# Patient Record
Sex: Male | Born: 1935 | Race: White | Hispanic: No | Marital: Married | Smoking: Former smoker
Health system: Southern US, Community
[De-identification: ages and names within clinical notes are randomized; demographics above are authoritative.]

## PROBLEM LIST (undated history)

## (undated) DIAGNOSIS — E785 Hyperlipidemia, unspecified: Secondary | ICD-10-CM

## (undated) DIAGNOSIS — K2270 Barrett's esophagus without dysplasia: Secondary | ICD-10-CM

## (undated) DIAGNOSIS — K219 Gastro-esophageal reflux disease without esophagitis: Secondary | ICD-10-CM

## (undated) DIAGNOSIS — I10 Essential (primary) hypertension: Secondary | ICD-10-CM

## (undated) HISTORY — PX: JOINT REPLACEMENT: SHX530

## (undated) HISTORY — PX: BACK SURGERY: SHX140

## (undated) HISTORY — PX: APPENDECTOMY: SHX54

---

## 2007-05-01 ENCOUNTER — Encounter: Admission: RE | Admit: 2007-05-01 | Discharge: 2007-05-01 | Payer: Self-pay | Admitting: Neurosurgery

## 2012-08-12 ENCOUNTER — Encounter (HOSPITAL_BASED_OUTPATIENT_CLINIC_OR_DEPARTMENT_OTHER): Payer: Self-pay | Admitting: *Deleted

## 2012-08-12 ENCOUNTER — Emergency Department (HOSPITAL_BASED_OUTPATIENT_CLINIC_OR_DEPARTMENT_OTHER)
Admission: EM | Admit: 2012-08-12 | Discharge: 2012-08-12 | Disposition: A | Discharge status: Other Acute Inpatient Hospital | Payer: Medicare Other | Attending: Emergency Medicine | Admitting: Emergency Medicine

## 2012-08-12 ENCOUNTER — Emergency Department (HOSPITAL_BASED_OUTPATIENT_CLINIC_OR_DEPARTMENT_OTHER): Payer: Medicare Other

## 2012-08-12 DIAGNOSIS — I10 Essential (primary) hypertension: Secondary | ICD-10-CM | POA: Insufficient documentation

## 2012-08-12 DIAGNOSIS — R209 Unspecified disturbances of skin sensation: Secondary | ICD-10-CM | POA: Insufficient documentation

## 2012-08-12 DIAGNOSIS — R42 Dizziness and giddiness: Secondary | ICD-10-CM | POA: Insufficient documentation

## 2012-08-12 DIAGNOSIS — R200 Anesthesia of skin: Secondary | ICD-10-CM

## 2012-08-12 DIAGNOSIS — R5381 Other malaise: Secondary | ICD-10-CM | POA: Insufficient documentation

## 2012-08-12 DIAGNOSIS — E785 Hyperlipidemia, unspecified: Secondary | ICD-10-CM | POA: Insufficient documentation

## 2012-08-12 DIAGNOSIS — R5383 Other fatigue: Secondary | ICD-10-CM | POA: Insufficient documentation

## 2012-08-12 DIAGNOSIS — Z8719 Personal history of other diseases of the digestive system: Secondary | ICD-10-CM | POA: Insufficient documentation

## 2012-08-12 DIAGNOSIS — R0609 Other forms of dyspnea: Secondary | ICD-10-CM | POA: Insufficient documentation

## 2012-08-12 DIAGNOSIS — R0989 Other specified symptoms and signs involving the circulatory and respiratory systems: Secondary | ICD-10-CM | POA: Insufficient documentation

## 2012-08-12 HISTORY — DX: Barrett's esophagus without dysplasia: K22.70

## 2012-08-12 HISTORY — DX: Hyperlipidemia, unspecified: E78.5

## 2012-08-12 HISTORY — DX: Essential (primary) hypertension: I10

## 2012-08-12 HISTORY — DX: Gastro-esophageal reflux disease without esophagitis: K21.9

## 2012-08-12 LAB — CBC WITH DIFFERENTIAL/PLATELET
Eosinophils Relative: 6 % — ABNORMAL HIGH (ref 0–5)
HCT: 39.6 % (ref 39.0–52.0)
Lymphocytes Relative: 27 % (ref 12–46)
Lymphs Abs: 1.8 10*3/uL (ref 0.7–4.0)
MCV: 92.3 fL (ref 78.0–100.0)
Monocytes Absolute: 0.8 10*3/uL (ref 0.1–1.0)
Neutro Abs: 3.9 10*3/uL (ref 1.7–7.7)
RBC: 4.29 MIL/uL (ref 4.22–5.81)
WBC: 7 10*3/uL (ref 4.0–10.5)

## 2012-08-12 LAB — COMPREHENSIVE METABOLIC PANEL
ALT: 26 U/L (ref 0–53)
AST: 18 U/L (ref 0–37)
CO2: 26 mEq/L (ref 19–32)
Calcium: 9.5 mg/dL (ref 8.4–10.5)
Chloride: 101 mEq/L (ref 96–112)
Creatinine, Ser: 1.1 mg/dL (ref 0.50–1.35)
GFR calc Af Amer: 73 mL/min — ABNORMAL LOW (ref >90)
GFR calc non Af Amer: 63 mL/min — ABNORMAL LOW (ref >90)
Glucose, Bld: 116 mg/dL — ABNORMAL HIGH (ref 70–99)
Sodium: 139 mEq/L (ref 135–145)
Total Bilirubin: 0.2 mg/dL — ABNORMAL LOW (ref 0.3–1.2)

## 2012-08-12 LAB — APTT: aPTT: 31 seconds (ref 24–37)

## 2012-08-12 NOTE — ED Notes (Addendum)
Pt sent here from [facility] for eval ? cva , pt states he woke this at am 6 with unsteady gait, dizzy and left sided  weakness.  Left grip is weaker than right

## 2012-08-12 NOTE — ED Notes (Signed)
Patient transported to CT 

## 2012-08-12 NOTE — ED Provider Notes (Signed)
History     CSN: [id number]  Arrival date & time 08/12/12  1526   First MD Initiated Contact with Patient 08/12/12 1545      Chief Complaint  Patient presents with  . Weakness  . Dizziness    (Consider location/radiation/quality/duration/timing/severity/associated sxs/prior treatment) HPI Pt states he woke this morning and felt dizzy while walking, losing his balance and falling against doorway. Dizziness described and feeling off balance and "head over feet" feeling. He also noticed L arm feeling like it was falling asleep and decreased sensation in LLE. States he took a "dizzy pill" and symptoms improved. Lucila Maine states he was sleepy after and had slurred speech.  Pt denies focal weakness currently though numbness still present. No dizziness currently. Pt admits to ongoing SOB especially with exertion. No cough, CP, fever, chills, N/V/D. Pt denies post cervical neck pain Past Medical History  Diagnosis Date  . Hypertension   . Barrett esophagus   . Hyperlipemia   . GERD (gastroesophageal reflux disease)     Past Surgical History  Procedure Date  . Back surgery   . Appendectomy   . Joint replacement     History reviewed. No pertinent family history.  History  Substance Use Topics  . Smoking status: Former Games developer  . Smokeless tobacco: Not on file  . Alcohol Use: No      Review of Systems  Constitutional: Negative for fever and chills.  HENT: Negative for hearing loss and neck pain.   Eyes: Negative for visual disturbance.  Respiratory: Positive for shortness of breath. Negative for cough and wheezing.   Cardiovascular: Negative for chest pain, palpitations and leg swelling.  Gastrointestinal: Negative for nausea, vomiting, abdominal pain, diarrhea and constipation.  Genitourinary: Negative for dysuria, hematuria and flank pain.  Musculoskeletal: Negative for myalgias and back pain.  Skin: Negative for pallor, rash and wound.  Neurological: Positive for  dizziness, speech difficulty and numbness. Negative for weakness, light-headedness and headaches.    Allergies  Review of patient's allergies indicates no known allergies.  Home Medications  No current outpatient prescriptions on file.  BP 139/97  Pulse 66  Temp 97.9 F (36.6 C) (Oral)  Resp 18  Ht 6' (1.829 m)  Wt 222 lb (100.699 kg)  BMI 30.11 kg/m2  SpO2 99%  Physical Exam  Nursing note and vitals reviewed. Constitutional: He is oriented to person, place, and time. He appears well-developed and well-nourished. No distress.  HENT:  Head: Normocephalic and atraumatic.  Mouth/Throat: Oropharynx is clear and moist.  Eyes: EOM are normal. Pupils are equal, round, and reactive to light.       Mild fatigable horizontal nystagmus   Neck: Normal range of motion. Neck supple.       No posterior cervical TTP  Cardiovascular: Normal rate and regular rhythm.  Exam reveals no gallop.   No murmur heard. Pulmonary/Chest: Effort normal and breath sounds normal. No respiratory distress. He has no wheezes. He has no rales.  Abdominal: Soft. Bowel sounds are normal. He exhibits no mass. There is no tenderness. There is no rebound and no guarding.  Musculoskeletal: Normal range of motion. He exhibits no edema and no tenderness.  Neurological: He is alert and oriented to person, place, and time.       Cn II-Xii intact. 5/5 motor in all ext. Decreased sensation in LUE/LLE. Finger to nose intact bl. Speech normal.   Skin: Skin is warm and dry. No rash noted. No erythema.  Psychiatric: He has a normal mood  and affect. His behavior is normal.    ED Course  Procedures (including critical care time)  Labs Reviewed  CBC WITH DIFFERENTIAL - Abnormal; Notable for the following:    Eosinophils Relative 6 (*)     All other components within normal limits  COMPREHENSIVE METABOLIC PANEL - Abnormal; Notable for the following:    Glucose, Bld 116 (*)     Total Bilirubin 0.2 (*)     GFR calc non Af  Amer 63 (*)     GFR calc Af Amer 73 (*)     All other components within normal limits  PROTIME-INR  APTT  PRO B NATRIURETIC PEPTIDE  TROPONIN I  URINALYSIS, ROUTINE W REFLEX MICROSCOPIC   Ct Head Wo Contrast  08/12/2012  *RADIOLOGY REPORT*  Clinical Data: Left-sided weakness and dizziness  CT HEAD WITHOUT CONTRAST  Technique:  Contiguous axial images were obtained from the base of the skull through the vertex without contrast.  Comparison: None.  Findings: The brain shows generalized atrophy.  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  There are chronic appearing small vessel changes in the hemispheric deep white matter.  No identifiably acute infarction, mass lesion, hemorrhage, hydrocephalus or extra-axial collection.  The calvarium is unremarkable.  There are some inflammatory changes of the maxillary sinuses and there is some fluid in the right mastoid air cells.  IMPRESSION: No identifiably acute infarction.  Chronic small vessel changes throughout the white matter.  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   Original Report Authenticated By: Paulina Fusi, M.D.    Dg Chest Port 1 View  08/12/2012  *RADIOLOGY REPORT*  Clinical Data: Unsteady gait and dizziness.  Possible stroke.  PORTABLE CHEST - 1 VIEW  Comparison: None.  Findings: 1557 hours.  There are low lung volumes with mild basilar atelectasis.  The heart size is at the upper limits of normal.  No confluent airspace opacity or pleural effusion is seen.  Telemetry leads overlie the chest.  IMPRESSION: Borderline heart size and low lung volumes.  No definite acute findings.   Original Report Authenticated By: Carey Bullocks, M.D.      1. Vertigo   2. Left sided numbness   3. Dyspnea on exertion      Date: 08/12/2012  Rate: 67  Rhythm: normal sinus rhythm  QRS Axis: left  Intervals: QRS prolonged  ST/T Wave abnormalities: normal  Conduction Disutrbances:nonspecific intraventricular conduction delay  Narrative Interpretation:   Old  EKG Reviewed: unchanged    MDM  Discussed with Dr Susie Cassette who will accept the pt in transfer.        Loren Racer, MD 08/12/12 1721

## 2012-08-12 NOTE — ED Notes (Addendum)
Pt resting quietly with family at bedside, denies any c/o or needs.

## 2014-01-31 IMAGING — CR DG CHEST 1V PORT
1 series · 1 of 1 positions shown · non-contrast
Comparison: None.

CLINICAL DATA: Unsteady gait and dizziness.  Possible stroke.

PORTABLE CHEST - 1 VIEW

[view not recorded]
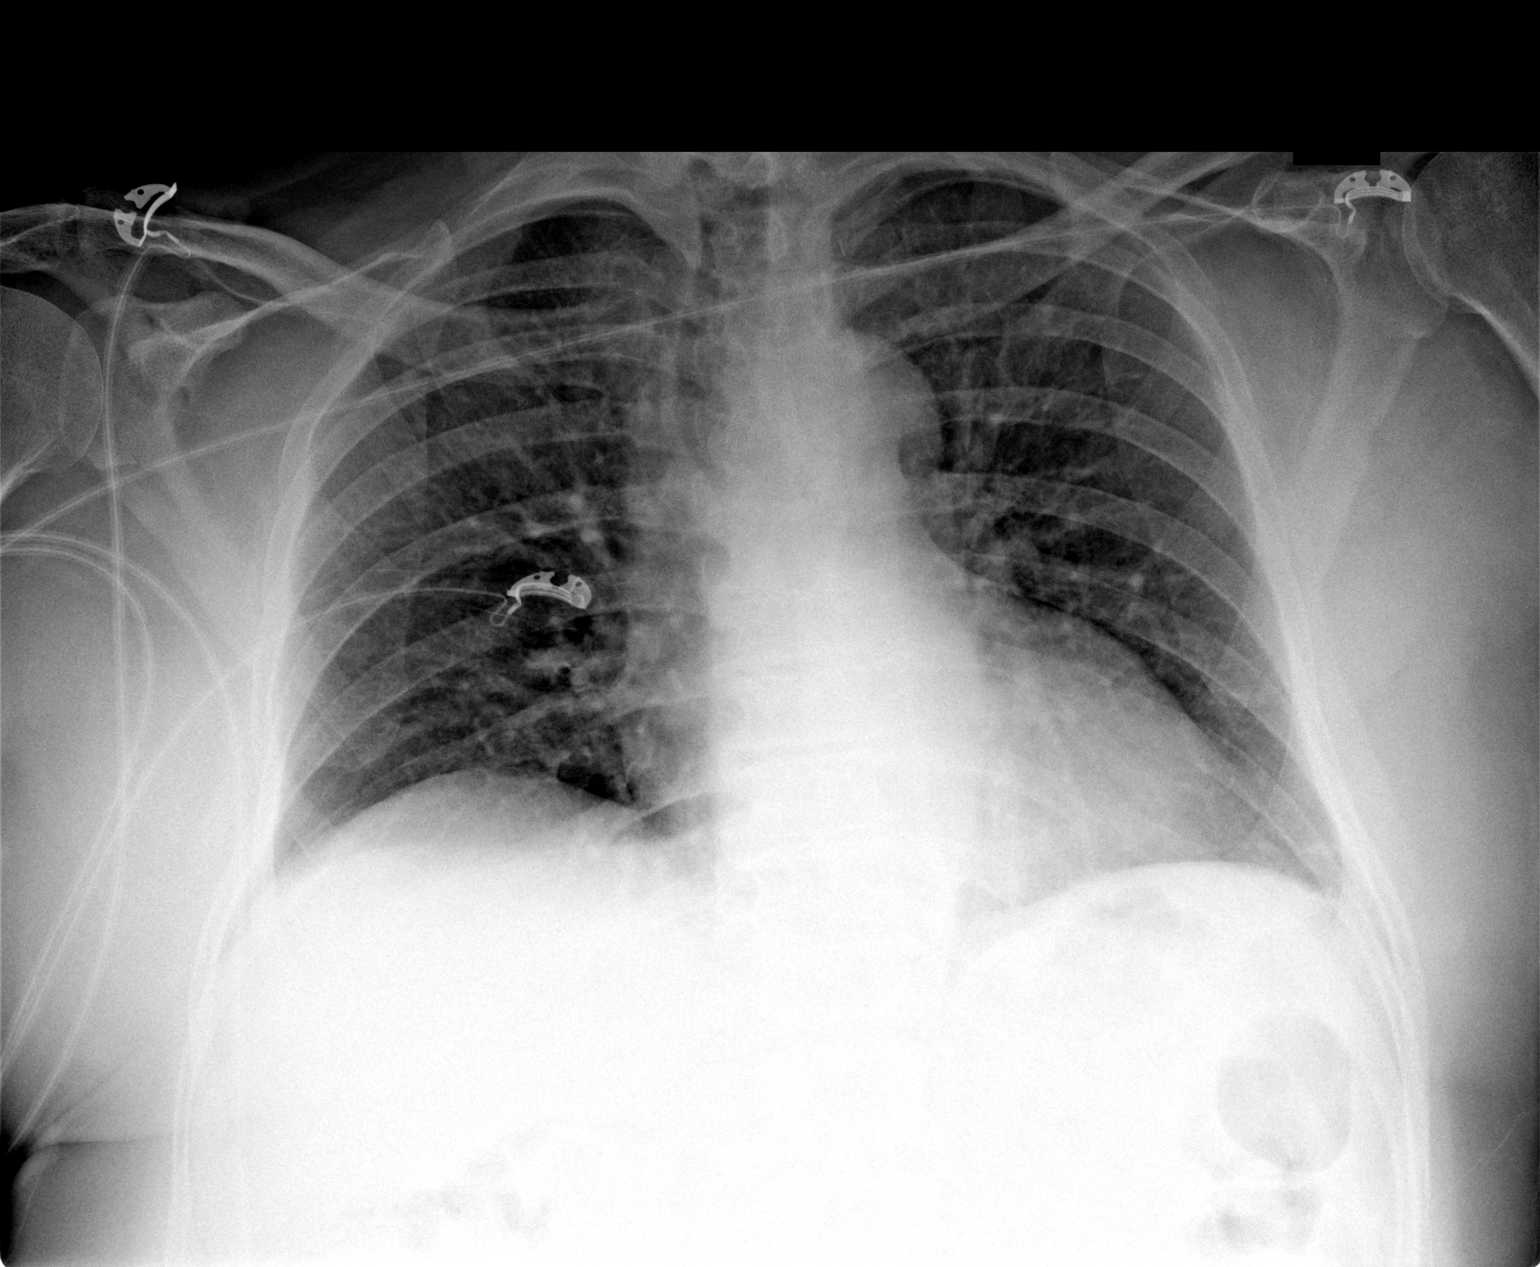

[1 of 1 positions shown; findings below may reference images not displayed]

FINDINGS: 6003 hours.  There are low lung volumes with mild basilar
atelectasis.  The heart size is at the upper limits of normal.  No
confluent airspace opacity or pleural effusion is seen.  Telemetry
leads overlie the chest.
IMPRESSION: Borderline heart size and low lung volumes.  No definite acute
findings.

## 2015-12-02 ENCOUNTER — Emergency Department (HOSPITAL_BASED_OUTPATIENT_CLINIC_OR_DEPARTMENT_OTHER)
Admission: EM | Admit: 2015-12-02 | Discharge: 2015-12-02 | Disposition: A | Discharge status: Home / Self Care | Payer: Medicare Other | Attending: Emergency Medicine | Admitting: Emergency Medicine

## 2015-12-02 ENCOUNTER — Encounter (HOSPITAL_BASED_OUTPATIENT_CLINIC_OR_DEPARTMENT_OTHER): Payer: Self-pay | Admitting: Emergency Medicine

## 2015-12-02 ENCOUNTER — Emergency Department (HOSPITAL_BASED_OUTPATIENT_CLINIC_OR_DEPARTMENT_OTHER): Payer: Medicare Other

## 2015-12-02 DIAGNOSIS — Y9389 Activity, other specified: Secondary | ICD-10-CM | POA: Insufficient documentation

## 2015-12-02 DIAGNOSIS — Y9289 Other specified places as the place of occurrence of the external cause: Secondary | ICD-10-CM | POA: Insufficient documentation

## 2015-12-02 DIAGNOSIS — Z8639 Personal history of other endocrine, nutritional and metabolic disease: Secondary | ICD-10-CM | POA: Insufficient documentation

## 2015-12-02 DIAGNOSIS — S0990XA Unspecified injury of head, initial encounter: Secondary | ICD-10-CM

## 2015-12-02 DIAGNOSIS — T148XXA Other injury of unspecified body region, initial encounter: Secondary | ICD-10-CM

## 2015-12-02 DIAGNOSIS — W228XXA Striking against or struck by other objects, initial encounter: Secondary | ICD-10-CM | POA: Insufficient documentation

## 2015-12-02 DIAGNOSIS — Z87891 Personal history of nicotine dependence: Secondary | ICD-10-CM | POA: Insufficient documentation

## 2015-12-02 DIAGNOSIS — S0003XA Contusion of scalp, initial encounter: Secondary | ICD-10-CM | POA: Diagnosis not present

## 2015-12-02 DIAGNOSIS — Z8719 Personal history of other diseases of the digestive system: Secondary | ICD-10-CM | POA: Insufficient documentation

## 2015-12-02 DIAGNOSIS — Z23 Encounter for immunization: Secondary | ICD-10-CM | POA: Insufficient documentation

## 2015-12-02 DIAGNOSIS — Y990 Civilian activity done for income or pay: Secondary | ICD-10-CM | POA: Insufficient documentation

## 2015-12-02 DIAGNOSIS — I10 Essential (primary) hypertension: Secondary | ICD-10-CM | POA: Diagnosis not present

## 2015-12-02 DIAGNOSIS — S0001XA Abrasion of scalp, initial encounter: Secondary | ICD-10-CM | POA: Diagnosis not present

## 2015-12-02 MED ORDER — TETANUS-DIPHTH-ACELL PERTUSSIS 5-2.5-18.5 LF-MCG/0.5 IM SUSP
0.5000 mL | Freq: Once | INTRAMUSCULAR | Status: AC
  Administered 2015-12-02: 0.5 mL via INTRAMUSCULAR
  Filled 2015-12-02: qty 0.5

## 2015-12-02 MED ORDER — ACETAMINOPHEN 325 MG PO TABS: 650.0000 mg | ORAL_TABLET | Freq: Once | ORAL | Status: DC

## 2015-12-02 NOTE — Discharge Instructions (Signed)
Head Injury, Adult °You have a head injury. Headaches and throwing up (vomiting) are common after a head injury. It should be easy to wake up from sleeping. Sometimes you must stay in the hospital. Most problems happen within the first 24 hours. Side effects may occur up to 7-10 days after the injury.  °WHAT ARE THE TYPES OF HEAD INJURIES? °Head injuries can be as minor as a bump. Some head injuries can be more severe. More severe head injuries include: °· A jarring injury to the brain (concussion). °· A bruise of the brain (contusion). This mean there is bleeding in the brain that can cause swelling. °· A cracked skull (skull fracture). °· Bleeding in the brain that collects, clots, and forms a bump (hematoma). °WHEN SHOULD I GET HELP RIGHT AWAY?  °· You are confused or sleepy. °· You cannot be woken up. °· You feel sick to your stomach (nauseous) or keep throwing up (vomiting). °· Your dizziness or unsteadiness is getting worse. °· You have very bad, lasting headaches that are not helped by medicine. Take medicines only as told by your doctor. °· You cannot use your arms or legs like normal. °· You cannot walk. °· You notice changes in the black spots in the center of the colored part of your eye (pupil). °· You have clear or bloody fluid coming from your nose or ears. °· You have trouble seeing. °During the next 24 hours after the injury, you must stay with someone who can watch you. This person should get help right away (call 911 in the [place] if you start to shake and are not able to control it (have seizures), you pass out, or you are unable to wake up. °HOW CAN I PREVENT A HEAD INJURY IN THE FUTURE? °· Wear seat belts. °· Wear a helmet while bike riding and playing sports like football. °· Stay away from dangerous activities around the house. °WHEN CAN I RETURN TO NORMAL ACTIVITIES AND ATHLETICS? °See your doctor before doing these activities. You should not do normal activities or play contact sports until 1  week after the following symptoms have stopped: °· Headache that does not go away. °· Dizziness. °· Poor attention. °· Confusion. °· Memory problems. °· Sickness to your stomach or throwing up. °· Tiredness. °· Fussiness. °· Bothered by bright lights or loud noises. °· Anxiousness or depression. °· Restless sleep. °MAKE SURE YOU:  °· Understand these instructions. °· Will watch your condition. °· Will get help right away if you are not doing well or get worse. °  °This information is not intended to replace advice given to you by your health care provider. Make sure you discuss any questions you have with your health care provider. °  °Document Released: 08/29/2008 Document Revised: 10/07/2014 Document Reviewed: 05/24/2013 °Elsevier Interactive Patient Education ©2016 [employer] ° °

## 2015-12-02 NOTE — ED Notes (Signed)
Pt in with head injury, was hit on top of head with crowbar. Alert, oriented and in NAD, denies using blood thinners.

## 2015-12-02 NOTE — ED Provider Notes (Signed)
CSN: [id number]     Arrival date & time 12/02/15  1402 History   First MD Initiated Contact with Patient 12/02/15 1413     Chief Complaint  Patient presents with  . Head Injury  . Head Laceration     (Consider location/radiation/quality/duration/timing/severity/associated sxs/prior Treatment) HPI Marc Wilcox is a 82 y.o. male with PMH significant for hypertension, Barrett's esophagus, hyperlipidemia, and GERD who presents with sudden onset, constant, moderate head injury. Patient reports he was working with a friend removing nails from a log with a crowbar when the nail broke loose and his friend accidentally hit him in the head with a crowbar. No LOC. Patient is not on anticoagulants. Denies dizziness, syncope, AMS, diplopia, visual disturbance, nausea, vomiting, neck pain, chest pain, shortness of breath, or abdominal pain. His last tetanus was approximately 10 years ago.  Past Medical History  Diagnosis Date  . Hypertension   . Barrett esophagus   . Hyperlipemia   . GERD (gastroesophageal reflux disease)    Past Surgical History  Procedure Laterality Date  . Back surgery    . Appendectomy    . Joint replacement     History reviewed. No pertinent family history. Social History  Substance Use Topics  . Smoking status: Former Games developer  . Smokeless tobacco: None  . Alcohol Use: No    Review of Systems All other systems negative unless otherwise stated in HPI    Allergies  Review of patient's allergies indicates no known allergies.  Home Medications   Prior to Admission medications   Not on File   BP 154/80 mmHg  Pulse 84  Temp(Src) 97.6 F (36.4 C) (Oral)  Resp 18  Ht  (1.803 m)  Wt 95.255 kg  BMI 29.30 kg/m2  SpO2 96% Physical Exam  Constitutional: He is oriented to person, place, and time. He appears well-developed and well-nourished.  Non-toxic appearance. He does not have a sickly appearance. He does not appear ill.  HENT:  Head: Normocephalic.  Head is with abrasion. Head is without raccoon's eyes, without Battle's sign and without laceration.  Mouth/Throat: Oropharynx is clear and moist.  Superficial 1 cm abrasion with small avulsion on left frontal scalp. No active bleeding. Small hematoma noted.  Eyes: Conjunctivae are normal. Pupils are equal, round, and reactive to light.  Neck: Normal range of motion. Neck supple.  Cardiovascular: Normal rate, regular rhythm and normal heart sounds.   No murmur heard. Pulmonary/Chest: Effort normal and breath sounds normal. No accessory muscle usage or stridor. No respiratory distress. He has no wheezes. He has no rhonchi. He has no rales.  Abdominal: Soft. Bowel sounds are normal. He exhibits no distension. There is no tenderness.  Musculoskeletal: Normal range of motion.  Lymphadenopathy:    He has no cervical adenopathy.  Neurological: He is alert and oriented to person, place, and time.  Mental Status:   AOx3.  Speech clear without dysarthria. Cranial Nerves:  I-not tested  II-PERRLA  III, IV, VI-EOMs intact  V-temporal and masseter strength intact  VII-symmetrical facial movements intact, no facial droop  VIII-hearing grossly intact bilaterally  IX, X-gag intact  XI-strength of sternomastoid and trapezius muscles 5/5  XII-tongue midline Motor:   Good muscle bulk and tone  Strength 5/5 bilaterally in upper and lower extremities   Cerebellar--intact RAMs, finger to nose intact bilaterally.  Gait normal  No pronator drift Sensory:  Intact in upper and lower extremities   Skin: Skin is warm and dry.  Psychiatric: He has  a normal mood and affect. His behavior is normal.    ED Course  Procedures (including critical care time) Labs Review Labs Reviewed - No data to display  Imaging Review Ct Head Wo Contrast  12/02/2015  CLINICAL DATA:  Left scalp laceration after head trauma, no loss of consciousness EXAM: CT HEAD WITHOUT CONTRAST TECHNIQUE: Contiguous axial images were  obtained from the base of the skull through the vertex without intravenous contrast. COMPARISON:  08/12/2012 FINDINGS: Significant diffuse cortical atrophy, age-related. Low attenuation in the deep white matter consistent with involutional change. Focal encephalomalacia anterior inferior left frontal lobe, stable. No evidence of acute vascular territory infarct or mass. No parenchymal hemorrhage or extra-axial fluid. Left frontal scalp hematoma, mild, noted. There is no skull fracture. Mucous retention cyst right maxillary sinus partially visualized. Improved aeration maxillary sinuses when compared to prior study. Remainder of the visualized paranasal sinuses are clear. Scattered bilateral mastoid air cell opacification. IMPRESSION: Mild scalp hematoma with no acute intracranial abnormality. Similar to prior study there is scattered bilateral mastoid air cell opacification with improved inflammation in the paranasal sinuses. Electronically Signed   By: Esperanza Heiraymond  Rubner M.D.   On: 12/02/2015 15:13   I have personally reviewed and evaluated these images and lab results as part of my medical decision-making.   EKG Interpretation None      MDM   Final diagnoses:  Head injury, initial encounter  Scalp abrasion, initial encounter  Hematoma    Patient presents with accidental head injury. VSS, NAD. On exam, 1 cm superficial abrasion with small avulsion on frontal scalp. No indication for suture or staple repair. Wound cleaned. Tetanus updated. Tylenol for headache. CT head unremarkable for mild scalp hematoma; otherwise, no acute intracranial abnormalities. Recommend Tylenol or ibuprofen for pain. Discussed return precautions. Follow up PCP. Patient agrees an Field seismologistimmunologist the above plan for discharge. Case has been discussed with and seen by Dr. Fayrene FearingJames who agrees with the above plan for discharge.     Cheri FowlerKayla Zayden Maffei, PA-C 12/02/15 1549  Rolland PorterMark James, MD 12/11/15 [phone]

## 2017-05-22 IMAGING — CT CT HEAD W/O CM
1 series · 15 of 30 positions shown, 19 images · non-contrast
Comparison: 08/12/2012

CLINICAL DATA: Left scalp laceration after head trauma, no loss of
consciousness

EXAM:
CT HEAD WITHOUT CONTRAST
TECHNIQUE: Contiguous axial images were obtained from the base of the skull
through the vertex without intravenous contrast.

[Series 2: head wo · axial · 0.43mm/px · z∈[-193,-58]mm · 15 of 33 slices shown, 19 images]
[im 2/33  brain]
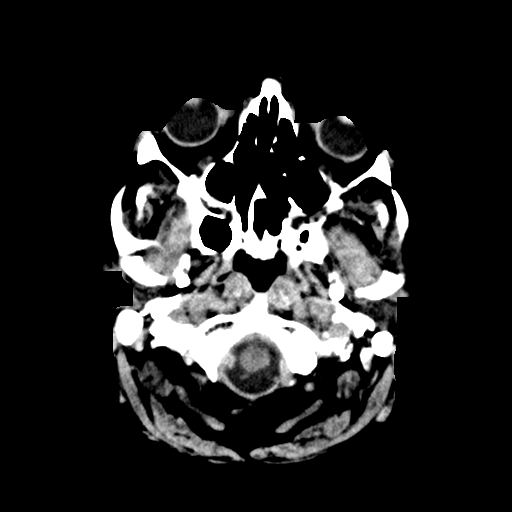
[im 2/33  bone]
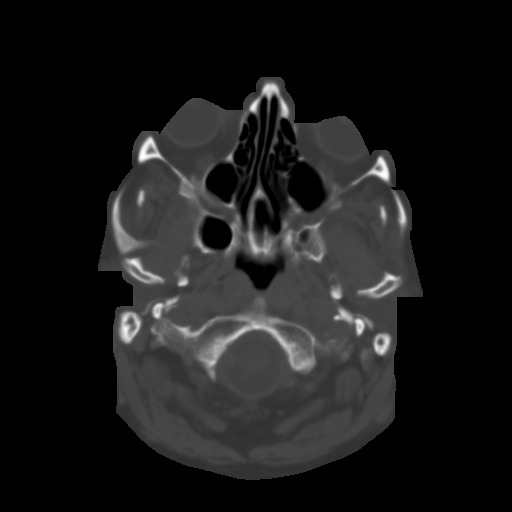
[im 4/33  brain]
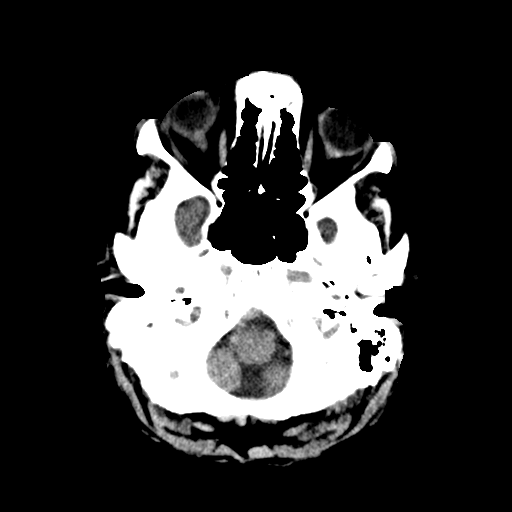
[im 6/33  brain]
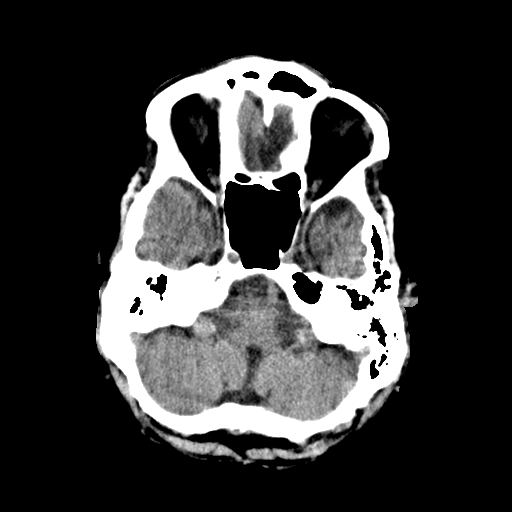
[im 8/33  brain]
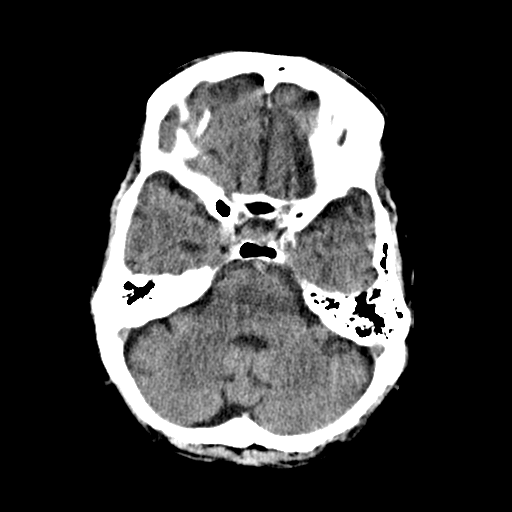
[im 10/33  brain]
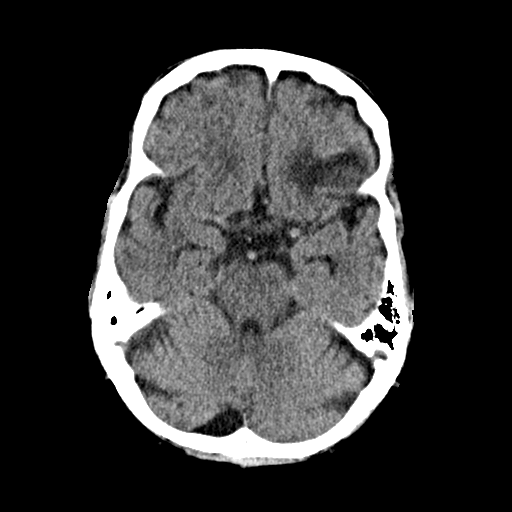
[im 10/33  bone]
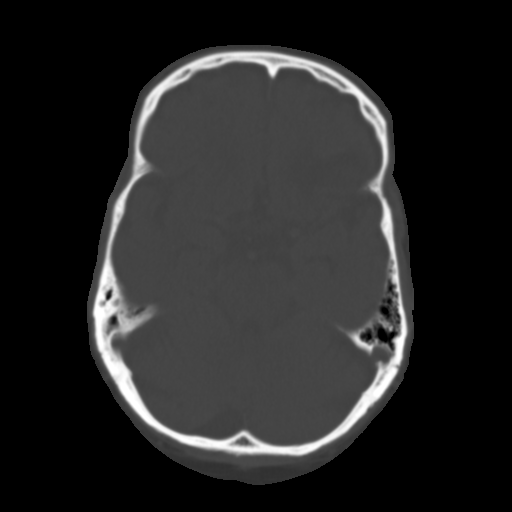
[im 13/33  brain]
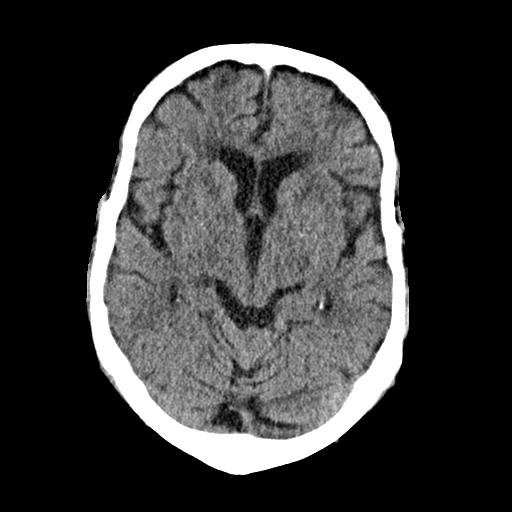
[im 15/33  brain]
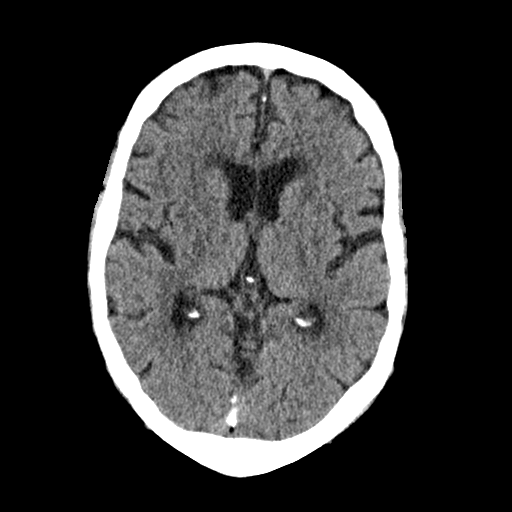
[im 17/33  brain]
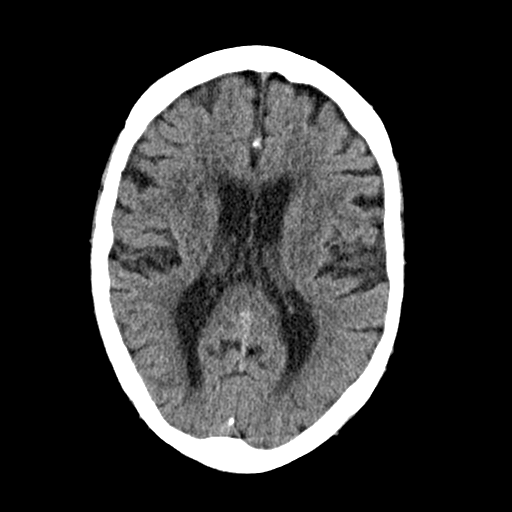
[im 18/33  brain]
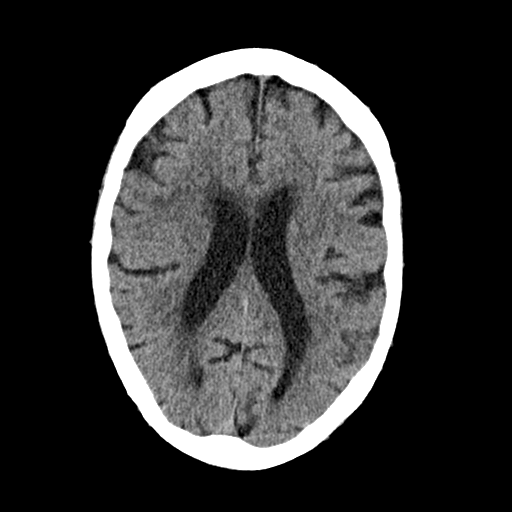
[im 18/33  bone]
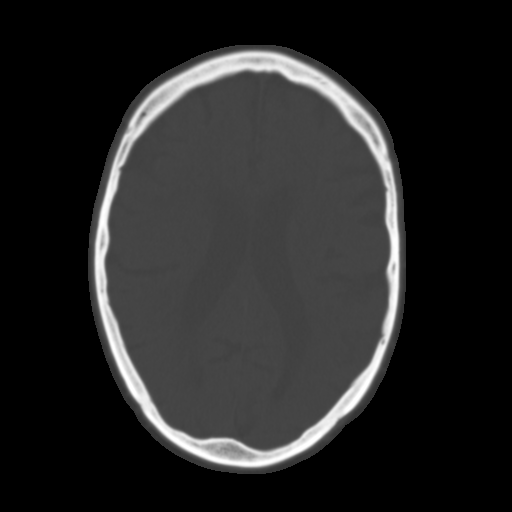
[im 20/33  brain]
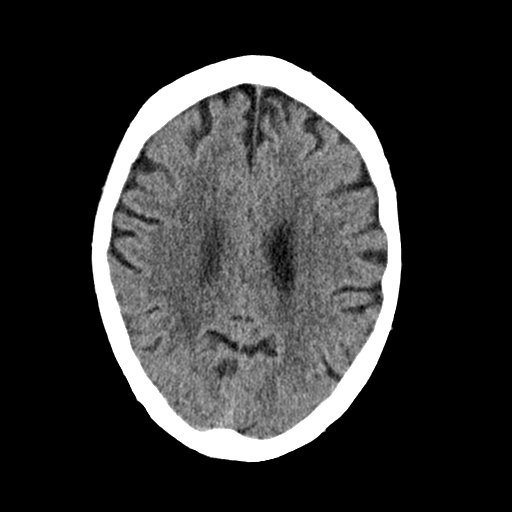
[im 23/33  brain]
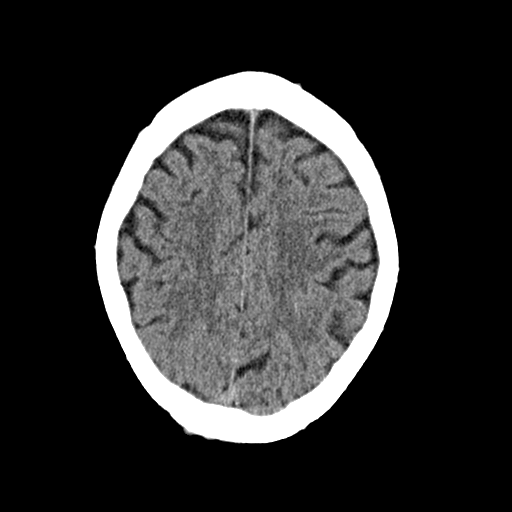
[im 25/33  brain]
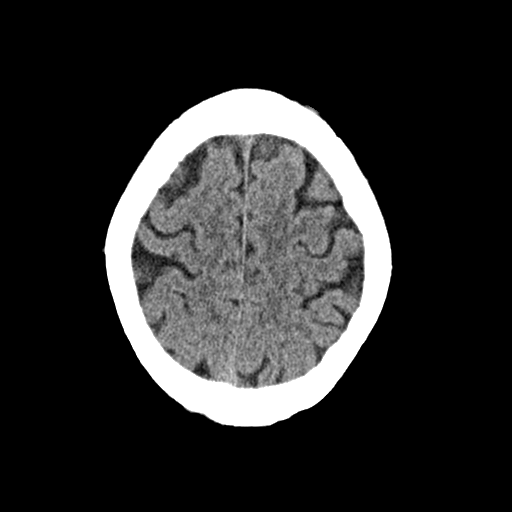
[im 27/33  brain]
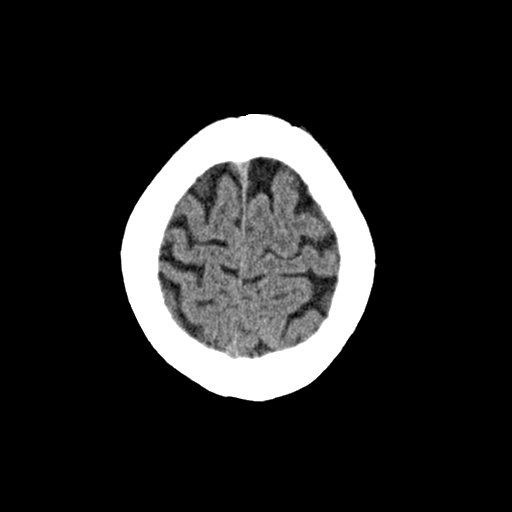
[im 27/33  bone]
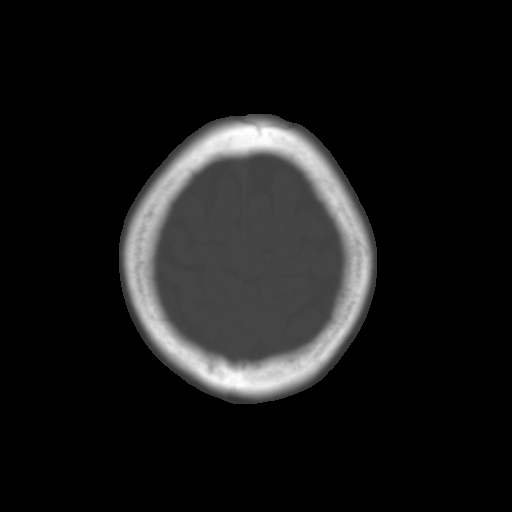
[im 29/33  brain]
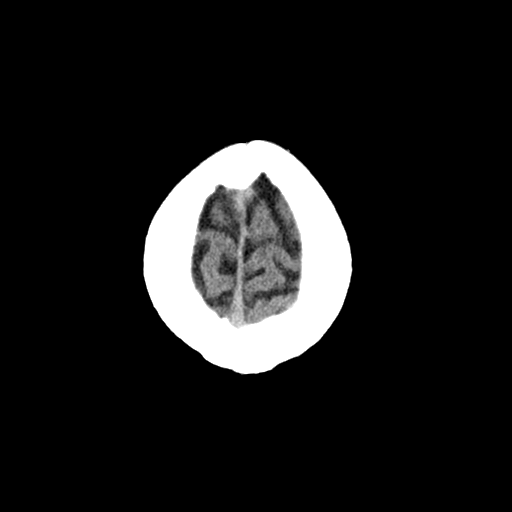
[im 31/33  brain]
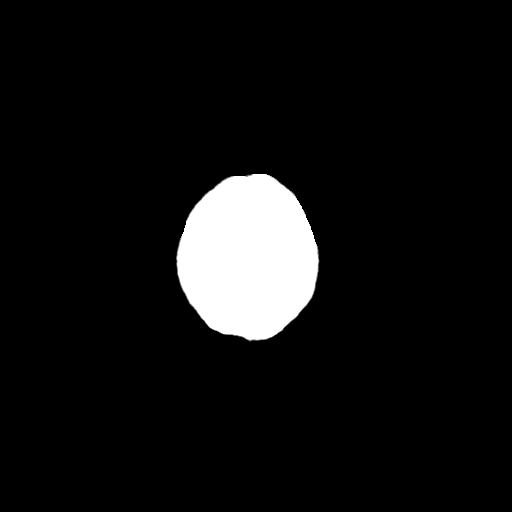

[15 of 30 positions shown; findings below may reference images not displayed]

FINDINGS: Significant diffuse cortical atrophy, age-related. Low attenuation
in the deep white matter consistent with involutional change. Focal
encephalomalacia anterior inferior left frontal lobe, stable. No
evidence of acute vascular territory infarct or mass. No parenchymal
hemorrhage or extra-axial fluid. Left frontal scalp hematoma, mild,
noted. There is no skull fracture. Mucous retention cyst right
maxillary sinus partially visualized. Improved aeration maxillary
sinuses when compared to prior study. Remainder of the visualized
paranasal sinuses are clear. Scattered bilateral mastoid air cell
opacification.
IMPRESSION: Mild scalp hematoma with no acute intracranial abnormality.

Similar to prior study there is scattered bilateral mastoid air cell
opacification with improved inflammation in the paranasal sinuses.
